# Patient Record
Sex: Female | Born: 1985 | Hispanic: Yes | Marital: Single | Smoking: Never smoker
Health system: Southern US, Community
[De-identification: ages and names within clinical notes are randomized; demographics above are authoritative.]

---

## 2015-04-11 ENCOUNTER — Emergency Department (HOSPITAL_COMMUNITY): Payer: Self-pay

## 2015-04-11 ENCOUNTER — Emergency Department (HOSPITAL_COMMUNITY): Payer: MEDICAID

## 2015-04-11 ENCOUNTER — Inpatient Hospital Stay (HOSPITAL_COMMUNITY)
Admission: EM | Admit: 2015-04-11 | Discharge: 2015-04-13 | DRG: 419 | Disposition: A | Discharge status: Home / Self Care | Payer: Self-pay | Attending: Surgery | Admitting: Surgery

## 2015-04-11 ENCOUNTER — Encounter (HOSPITAL_COMMUNITY): Payer: Self-pay | Admitting: Emergency Medicine

## 2015-04-11 DIAGNOSIS — Z6827 Body mass index (BMI) 27.0-27.9, adult: Secondary | ICD-10-CM

## 2015-04-11 DIAGNOSIS — K819 Cholecystitis, unspecified: Secondary | ICD-10-CM

## 2015-04-11 DIAGNOSIS — K810 Acute cholecystitis: Secondary | ICD-10-CM

## 2015-04-11 DIAGNOSIS — R1010 Upper abdominal pain, unspecified: Secondary | ICD-10-CM

## 2015-04-11 DIAGNOSIS — R740 Nonspecific elevation of levels of transaminase and lactic acid dehydrogenase [LDH]: Secondary | ICD-10-CM | POA: Diagnosis present

## 2015-04-11 DIAGNOSIS — E876 Hypokalemia: Secondary | ICD-10-CM | POA: Diagnosis present

## 2015-04-11 DIAGNOSIS — K8000 Calculus of gallbladder with acute cholecystitis without obstruction: Principal | ICD-10-CM | POA: Diagnosis present

## 2015-04-11 LAB — COMPREHENSIVE METABOLIC PANEL
ALT: 258 U/L — ABNORMAL HIGH (ref 14–54)
AST: 72 U/L — AB (ref 15–41)
Albumin: 4.1 g/dL (ref 3.5–5.0)
Alkaline Phosphatase: 138 U/L — ABNORMAL HIGH (ref 38–126)
Anion gap: 13 (ref 5–15)
BUN: 11 mg/dL (ref 6–20)
CHLORIDE: 104 mmol/L (ref 101–111)
CO2: 25 mmol/L (ref 22–32)
Calcium: 9.3 mg/dL (ref 8.9–10.3)
Creatinine, Ser: 0.63 mg/dL (ref 0.44–1.00)
Glucose, Bld: 99 mg/dL (ref 65–99)
Potassium: 3.3 mmol/L — ABNORMAL LOW (ref 3.5–5.1)
Sodium: 142 mmol/L (ref 135–145)
Total Bilirubin: 0.4 mg/dL (ref 0.3–1.2)
Total Protein: 7.6 g/dL (ref 6.5–8.1)

## 2015-04-11 LAB — PREGNANCY, URINE: Preg Test, Ur: NEGATIVE

## 2015-04-11 LAB — CBC
HEMATOCRIT: 36.4 % (ref 36.0–46.0)
HEMOGLOBIN: 12.2 g/dL (ref 12.0–15.0)
MCH: 29.8 pg (ref 26.0–34.0)
MCHC: 33.5 g/dL (ref 30.0–36.0)
MCV: 88.8 fL (ref 78.0–100.0)
Platelets: 304 10*3/uL (ref 150–400)
RBC: 4.1 MIL/uL (ref 3.87–5.11)
RDW: 13.7 % (ref 11.5–15.5)
WBC: 6.2 10*3/uL (ref 4.0–10.5)

## 2015-04-11 LAB — URINALYSIS, ROUTINE W REFLEX MICROSCOPIC
Glucose, UA: NEGATIVE mg/dL
Nitrite: NEGATIVE
PH: 5.5 (ref 5.0–8.0)
Protein, ur: NEGATIVE mg/dL
SPECIFIC GRAVITY, URINE: 1.027 (ref 1.005–1.030)

## 2015-04-11 LAB — URINE MICROSCOPIC-ADD ON

## 2015-04-11 LAB — LIPASE, BLOOD: LIPASE: 21 U/L (ref 11–51)

## 2015-04-11 MED ORDER — SIMETHICONE 80 MG PO CHEW: 40.0000 mg | CHEWABLE_TABLET | Freq: Four times a day (QID) | ORAL | Status: DC | PRN

## 2015-04-11 MED ORDER — MORPHINE SULFATE (PF) 2 MG/ML IV SOLN
1.0000 mg | INTRAVENOUS | Status: DC | PRN
  Administered 2015-04-12: 2 mg via INTRAVENOUS
  Administered 2015-04-12 (×2): 4 mg via INTRAVENOUS
  Administered 2015-04-12 (×2): 2 mg via INTRAVENOUS
  Filled 2015-04-11: qty 1
  Filled 2015-04-11: qty 2
  Filled 2015-04-11: qty 1
  Filled 2015-04-11: qty 2
  Filled 2015-04-11: qty 1
  Filled 2015-04-11: qty 2

## 2015-04-11 MED ORDER — ONDANSETRON HCL 4 MG/2ML IJ SOLN
4.0000 mg | Freq: Four times a day (QID) | INTRAMUSCULAR | Status: DC | PRN
  Administered 2015-04-12: 4 mg via INTRAVENOUS
  Filled 2015-04-11 (×2): qty 2

## 2015-04-11 MED ORDER — ZOLPIDEM TARTRATE 5 MG PO TABS: 5.0000 mg | ORAL_TABLET | Freq: Every evening | ORAL | Status: DC | PRN

## 2015-04-11 MED ORDER — DEXTROSE 5 % IV SOLN
2.0000 g | INTRAVENOUS | Status: DC
  Administered 2015-04-12 (×2): 2 g via INTRAVENOUS
  Filled 2015-04-11 (×2): qty 2

## 2015-04-11 MED ORDER — PROMETHAZINE HCL 25 MG/ML IJ SOLN: 12.5000 mg | Freq: Four times a day (QID) | INTRAMUSCULAR | Status: DC | PRN

## 2015-04-11 MED ORDER — SODIUM CHLORIDE 0.9 % IV BOLUS (SEPSIS)
1000.0000 mL | Freq: Once | INTRAVENOUS | Status: AC
  Administered 2015-04-11: 1000 mL via INTRAVENOUS

## 2015-04-11 MED ORDER — KCL IN DEXTROSE-NACL 20-5-0.45 MEQ/L-%-% IV SOLN
INTRAVENOUS | Status: DC
  Administered 2015-04-12: 01:00:00 via INTRAVENOUS
  Filled 2015-04-11: qty 1000

## 2015-04-11 MED ORDER — PANTOPRAZOLE SODIUM 40 MG IV SOLR
40.0000 mg | Freq: Every day | INTRAVENOUS | Status: DC
  Administered 2015-04-12 (×2): 40 mg via INTRAVENOUS
  Filled 2015-04-11 (×2): qty 40

## 2015-04-11 MED ORDER — ONDANSETRON 4 MG PO TBDP: 4.0000 mg | ORAL_TABLET | Freq: Four times a day (QID) | ORAL | Status: DC | PRN

## 2015-04-11 MED ORDER — DIPHENHYDRAMINE HCL 50 MG/ML IJ SOLN: 12.5000 mg | Freq: Four times a day (QID) | INTRAMUSCULAR | Status: DC | PRN

## 2015-04-11 MED ORDER — DIPHENHYDRAMINE HCL 12.5 MG/5ML PO ELIX: 12.5000 mg | ORAL_SOLUTION | Freq: Four times a day (QID) | ORAL | Status: DC | PRN

## 2015-04-11 NOTE — H&P (Signed)
Gloria Brown is an 30 y.o. female.  (history obtained via phone interpreter) Chief Complaint: abd pain, n/v. HPI: 30 yo Hispanic female came to ED for ongoing nausea/vomiting/abd pain. Some of the emesis became blood tinged. She developed upper abd pain starting Thursday. It has been intermittent but some discomfort has been constant. She has had ongoing episodes of n/v. No prior episodes. No fevers but chills. Moving bowels. No prior symptoms. Denies allergies and daily medications. Denies daily prescriptions. Has had surgery "to prevent pregnancy". No smoking. Has english speaking friend at bedside  History reviewed. No pertinent past medical history.  History reviewed. No pertinent past surgical history.  History reviewed. No pertinent family history. Social History:  reports that she has never smoked. She has never used smokeless tobacco. She reports that she does not drink alcohol or use illicit drugs.  Allergies: No Known Allergies   (Not in a hospital admission)  Results for orders placed or performed during the hospital encounter of 04/11/15 (from the past 48 hour(s))  Lipase, blood     Status: None   Collection Time: 04/11/15  5:59 PM  Result Value Ref Range   Lipase 21 11 - 51 U/L  Comprehensive metabolic panel     Status: Abnormal   Collection Time: 04/11/15  5:59 PM  Result Value Ref Range   Sodium 142 135 - 145 mmol/L   Potassium 3.3 (L) 3.5 - 5.1 mmol/L   Chloride 104 101 - 111 mmol/L   CO2 25 22 - 32 mmol/L   Glucose, Bld 99 65 - 99 mg/dL   BUN 11 6 - 20 mg/dL   Creatinine, Ser 0.63 0.44 - 1.00 mg/dL   Calcium 9.3 8.9 - 10.3 mg/dL   Total Protein 7.6 6.5 - 8.1 g/dL   Albumin 4.1 3.5 - 5.0 g/dL   AST 72 (H) 15 - 41 U/L   ALT 258 (H) 14 - 54 U/L   Alkaline Phosphatase 138 (H) 38 - 126 U/L   Total Bilirubin 0.4 0.3 - 1.2 mg/dL   GFR calc non Af Amer >60 >60 mL/min   GFR calc Af Amer >60 >60 mL/min    Comment: (NOTE) The eGFR has been calculated using the CKD  EPI equation. This calculation has not been validated in all clinical situations. eGFR's persistently <60 mL/min signify possible Chronic Kidney Disease.    Anion gap 13 5 - 15  CBC     Status: None   Collection Time: 04/11/15  5:59 PM  Result Value Ref Range   WBC 6.2 4.0 - 10.5 K/uL   RBC 4.10 3.87 - 5.11 MIL/uL   Hemoglobin 12.2 12.0 - 15.0 g/dL   HCT 36.4 36.0 - 46.0 %   MCV 88.8 78.0 - 100.0 fL   MCH 29.8 26.0 - 34.0 pg   MCHC 33.5 30.0 - 36.0 g/dL   RDW 13.7 11.5 - 15.5 %   Platelets 304 150 - 400 K/uL  Urinalysis, Routine w reflex microscopic (not at [facility])     Status: Abnormal   Collection Time: 04/11/15  6:04 PM  Result Value Ref Range   Color, Urine AMBER (A) YELLOW    Comment: BIOCHEMICALS MAY BE AFFECTED BY COLOR   APPearance HAZY (A) CLEAR   Specific Gravity, Urine 1.027 1.005 - 1.030   pH 5.5 5.0 - 8.0   Glucose, UA NEGATIVE NEGATIVE mg/dL   Hgb urine dipstick LARGE (A) NEGATIVE   Bilirubin Urine SMALL (A) NEGATIVE   Ketones, ur >80 (A)  NEGATIVE mg/dL   Protein, ur NEGATIVE NEGATIVE mg/dL   Nitrite NEGATIVE NEGATIVE   Leukocytes, UA SMALL (A) NEGATIVE  Urine microscopic-add on     Status: Abnormal   Collection Time: 04/11/15  6:04 PM  Result Value Ref Range   Squamous Epithelial / LPF 0-5 (A) NONE SEEN   WBC, UA 0-5 0 - 5 WBC/hpf   RBC / HPF 6-30 0 - 5 RBC/hpf   Bacteria, UA FEW (A) NONE SEEN  Pregnancy, urine     Status: None   Collection Time: 04/11/15  7:08 PM  Result Value Ref Range   Preg Test, Ur NEGATIVE NEGATIVE   US Abdomen Complete  04/11/2015  CLINICAL DATA:  Upper abdominal pain for 4 days. EXAM: ABDOMEN ULTRASOUND COMPLETE COMPARISON:  None. FINDINGS: Gallbladder: The gallbladder wall is thickened at 4.1 mm. Mobile stones and sludge are present. There is no sonographic Murphy's sign. Common bile duct: Diameter: 1.9 mm, within normal limits Liver: No focal lesion identified. Within normal limits in parenchymal echogenicity. IVC: No abnormality  visualized. Pancreas: Visualized portion unremarkable. Spleen: 5.8 cm, within normal limits.  No focal lesions are present. Right Kidney: Length: 10.6 cm, within normal limits a. Echogenicity within normal limits. No mass or hydronephrosis visualized. Left Kidney: Length: 10.8 cm, within normal limits. Echogenicity within normal limits. No mass or hydronephrosis visualized. Abdominal aorta: 2.1 cm, within normal limits Other findings: None. IMPRESSION: 1. Mobile gallstones and sludge with mild wall thickening. This is concerning for early cholecystitis. 2. Otherwise negative ultrasound. Electronically Signed   By: San Morelle M.D.   On: 04/11/2015 20:54    Review of Systems  Constitutional: Positive for chills. Negative for fever and weight loss.  HENT: Negative for nosebleeds.   Eyes: Negative for blurred vision.  Respiratory: Negative for shortness of breath.   Cardiovascular: Negative for chest pain, palpitations, orthopnea and PND.       Denies DOE  Gastrointestinal: Positive for nausea, vomiting and abdominal pain. Negative for constipation.  Genitourinary: Negative for dysuria and hematuria.  Musculoskeletal: Negative.   Skin: Negative for itching and rash.  Neurological: Negative for dizziness, focal weakness, seizures, loss of consciousness and headaches.       Denies TIAs, amaurosis fugax  Endo/Heme/Allergies: Does not bruise/bleed easily.  Psychiatric/Behavioral: The patient is not nervous/anxious.     Blood pressure 144/93, pulse 67, temperature 98.5 F (36.9 C), temperature source Oral, resp. rate 16, height 4' 11"  (1.499 m), weight 61.054 kg (134 lb 9.6 oz), last menstrual period 04/09/2015, SpO2 100 %. Physical Exam  Vitals reviewed. Constitutional: She is oriented to person, place, and time. She appears well-developed and well-nourished. No distress.  HENT:  Head: Normocephalic and atraumatic.  Right Ear: External ear normal.  Left Ear: External ear normal.  Eyes:  Conjunctivae are normal. No scleral icterus.  Neck: Normal range of motion. Neck supple. No tracheal deviation present. No thyromegaly present.  Cardiovascular: Normal rate and normal heart sounds.   Respiratory: Effort normal and breath sounds normal. No stridor. No respiratory distress. She has no wheezes.  GI: Soft. She exhibits no distension. There is tenderness in the right upper quadrant and epigastric area. There is no rigidity, no rebound and no guarding.    RUQ/epigastric TTP; no rt, guarding, peritonitis  Musculoskeletal: She exhibits no edema or tenderness.  Lymphadenopathy:    She has no cervical adenopathy.  Neurological: She is alert and oriented to person, place, and time. She exhibits normal muscle tone.  Skin: Skin  is warm and dry. No rash noted. She is not diaphoretic. No erythema. No pallor.  Psychiatric: She has a normal mood and affect. Her behavior is normal. Judgment and thought content normal.     Assessment/Plan Acute calculous cholecystitis Elevated transaminases likely due to above Hypokalemia  I believe the patient's symptoms are consistent with gallbladder disease.  We discussed gallbladder disease.   I discussed laparoscopic cholecystectomy with IOC in detail.  The patient was given educational material in Spanish as well as shown diagrams detailing the procedure.  We discussed the risks and benefits of a laparoscopic cholecystectomy including, but not limited to bleeding, infection, injury to surrounding structures such as the intestine or liver, bile leak, retained gallstones, need to convert to an open procedure, prolonged diarrhea, blood clots such as  DVT, common bile duct injury, anesthesia risks, and possible need for additional procedures.  We discussed the typical post-operative recovery course. I explained that the likelihood of improvement of their symptoms is good.  Admit Clears, npo p mn Ivf  Iv pain meds Iv abx Surgery tentatively Monday  with Dr Georgette Dover  All of this was done with phone interpreter Leighton Ruff. Redmond Pulling, MD, FACS General, Bariatric, & Minimally Invasive Surgery [facility], [place]  [facility] M 04/11/2015, 10:29 PM

## 2015-04-11 NOTE — ED Provider Notes (Signed)
CSN: [id number]     Arrival date & time 04/11/15  1651 History   First MD Initiated Contact with Patient 04/11/15 1855     Chief Complaint  Patient presents with  . Abdominal Pain  . Hematemesis  . Dizziness     (Consider location/radiation/quality/duration/timing/severity/associated sxs/prior Treatment) HPI Comments: History is obtained through a Spanish interpreter line. Patient is a 30 year old Hispanic female. She complains of a one-week history of worsening pain across her upper abdomen. She's had associated nausea and vomiting. She does say that the pain radiates to her back. It is worse after eating. It now as a constant throbbing pain. She's has had decreased appetite. Her emesis is mostly nonbloody and nonbilious but she has had 2 episodes where it's been streaked with blood. She's had no vomiting today. She denies any known fevers but she's had chills. She denies any change in bowel habits. She denies any urinary symptoms. Her last menstrual period was 2 days ago. She denies any past abdominal surgeries.  Patient is a 30 y.o. female presenting with abdominal pain and dizziness.  Abdominal Pain Associated symptoms: chills, fatigue, nausea and vomiting   Associated symptoms: no chest pain, no cough, no diarrhea, no fever, no hematuria and no shortness of breath   Dizziness Associated symptoms: nausea and vomiting   Associated symptoms: no blood in stool, no chest pain, no diarrhea, no headaches, no shortness of breath and no weakness     History reviewed. No pertinent past medical history. History reviewed. No pertinent past surgical history. History reviewed. No pertinent family history. Social History  Substance Use Topics  . Smoking status: Never Smoker   . Smokeless tobacco: Never Used  . Alcohol Use: No   OB History    No data available     Review of Systems  Constitutional: Positive for chills and fatigue. Negative for fever and diaphoresis.  HENT: Negative for  congestion, rhinorrhea and sneezing.   Eyes: Negative.   Respiratory: Negative for cough, chest tightness and shortness of breath.   Cardiovascular: Negative for chest pain and leg swelling.  Gastrointestinal: Positive for nausea, vomiting and abdominal pain. Negative for diarrhea and blood in stool.  Genitourinary: Negative for frequency, hematuria, flank pain and difficulty urinating.  Musculoskeletal: Negative for back pain and arthralgias.  Skin: Negative for rash.  Neurological: Positive for dizziness. Negative for speech difficulty, weakness, numbness and headaches.      Allergies  Review of patient's allergies indicates no known allergies.  Home Medications   Prior to Admission medications   Not on File   BP 144/93 mmHg  Pulse 67  Temp(Src) 98.5 F (36.9 C) (Oral)  Resp 16  Ht  (1.499 m)  Wt 134 lb 9.6 oz (61.054 kg)  BMI 27.17 kg/m2  SpO2 100%  LMP 04/09/2015 (Exact Date) Physical Exam  Constitutional: She is oriented to person, place, and time. She appears well-developed and well-nourished.  HENT:  Head: Normocephalic and atraumatic.  Eyes: Pupils are equal, round, and reactive to light.  Neck: Normal range of motion. Neck supple.  Cardiovascular: Normal rate, regular rhythm and normal heart sounds.   Pulmonary/Chest: Effort normal and breath sounds normal. No respiratory distress. She has no wheezes. She has no rales. She exhibits no tenderness.  Abdominal: Soft. Bowel sounds are normal. There is tenderness (positive tenderness across the upper abdomen and epigastrium). There is no rebound and no guarding.  Musculoskeletal: Normal range of motion. She exhibits no edema.  Lymphadenopathy:  She has no cervical adenopathy.  Neurological: She is alert and oriented to person, place, and time.  Skin: Skin is warm and dry. No rash noted.  Psychiatric: She has a normal mood and affect.    ED Course  Procedures (including critical care time) Labs  Review Labs Reviewed  COMPREHENSIVE METABOLIC PANEL - Abnormal; Notable for the following:    Potassium 3.3 (*)    AST 72 (*)    ALT 258 (*)    Alkaline Phosphatase 138 (*)    All other components within normal limits  URINALYSIS, ROUTINE W REFLEX MICROSCOPIC (NOT AT [facility]) - Abnormal; Notable for the following:    Color, Urine AMBER (*)    APPearance HAZY (*)    Hgb urine dipstick LARGE (*)    Bilirubin Urine SMALL (*)    Ketones, ur >80 (*)    Leukocytes, UA SMALL (*)    All other components within normal limits  URINE MICROSCOPIC-ADD ON - Abnormal; Notable for the following:    Squamous Epithelial / LPF 0-5 (*)    Bacteria, UA FEW (*)    All other components within normal limits  LIPASE, BLOOD  CBC  PREGNANCY, URINE    Imaging Review US Abdomen Complete  04/11/2015  CLINICAL DATA:  Upper abdominal pain for 4 days. EXAM: ABDOMEN ULTRASOUND COMPLETE COMPARISON:  None. FINDINGS: Gallbladder: The gallbladder wall is thickened at 4.1 mm. Mobile stones and sludge are present. There is no sonographic Murphy's sign. Common bile duct: Diameter: 1.9 mm, within normal limits Liver: No focal lesion identified. Within normal limits in parenchymal echogenicity. IVC: No abnormality visualized. Pancreas: Visualized portion unremarkable. Spleen: 5.8 cm, within normal limits.  No focal lesions are present. Right Kidney: Length: 10.6 cm, within normal limits a. Echogenicity within normal limits. No mass or hydronephrosis visualized. Left Kidney: Length: 10.8 cm, within normal limits. Echogenicity within normal limits. No mass or hydronephrosis visualized. Abdominal aorta: 2.1 cm, within normal limits Other findings: None. IMPRESSION: 1. Mobile gallstones and sludge with mild wall thickening. This is concerning for early cholecystitis. 2. Otherwise negative ultrasound. Electronically Signed   By: Marin Roberts M.D.   On: 04/11/2015 20:54   I have personally reviewed and evaluated these images and  lab results as part of my medical decision-making.   EKG Interpretation None      MDM   Final diagnoses:  Cholecystitis    Patient has an ultrasound showing positive gallstones with mild wall thickening. Her white count is normal and she is afebrile. However LFTs are mildly elevated. Her lipase is normal. I consulted Dr. Andrey Campanile with general surgery who will admit the patient for a cholecystectomy. Patient currently denies the need for any pain medication.    Rolan Bucco, MD 04/11/15 2248

## 2015-04-11 NOTE — ED Notes (Addendum)
Pt from home with c/o LUQ pain with emesis, chills, and dizziness since this past Thurs.  Pt reports she is now vomiting blood.  Denies diarrhea or fever.  Pt states she hasn't been around anyone who has been sick.  Pt speaks only [place].  Family friend at bedside translating.  NAD, A&O.

## 2015-04-11 NOTE — ED Notes (Signed)
Report attempted 

## 2015-04-12 ENCOUNTER — Inpatient Hospital Stay (HOSPITAL_COMMUNITY): Payer: Self-pay

## 2015-04-12 ENCOUNTER — Encounter (HOSPITAL_COMMUNITY): Admission: EM | Disposition: A | Discharge status: Home / Self Care | Payer: Self-pay | Source: Home / Self Care

## 2015-04-12 ENCOUNTER — Inpatient Hospital Stay (HOSPITAL_COMMUNITY): Payer: MEDICAID | Admitting: Anesthesiology

## 2015-04-12 ENCOUNTER — Encounter (HOSPITAL_COMMUNITY): Payer: Self-pay | Admitting: Anesthesiology

## 2015-04-12 ENCOUNTER — Inpatient Hospital Stay (HOSPITAL_COMMUNITY): Payer: Self-pay | Admitting: Anesthesiology

## 2015-04-12 HISTORY — PX: CHOLECYSTECTOMY: SHX55

## 2015-04-12 LAB — COMPREHENSIVE METABOLIC PANEL
ALBUMIN: 3.4 g/dL — AB (ref 3.5–5.0)
ALT: 169 U/L — AB (ref 14–54)
AST: 43 U/L — AB (ref 15–41)
Alkaline Phosphatase: 102 U/L (ref 38–126)
Anion gap: 5 (ref 5–15)
BUN: 10 mg/dL (ref 6–20)
CHLORIDE: 110 mmol/L (ref 101–111)
CO2: 25 mmol/L (ref 22–32)
CREATININE: 0.6 mg/dL (ref 0.44–1.00)
Calcium: 8.4 mg/dL — ABNORMAL LOW (ref 8.9–10.3)
GFR calc Af Amer: >60 mL/min (ref >60)
GFR calc non Af Amer: >60 mL/min (ref >60)
GLUCOSE: 100 mg/dL — AB (ref 65–99)
POTASSIUM: 3.5 mmol/L (ref 3.5–5.1)
SODIUM: 140 mmol/L (ref 135–145)
Total Bilirubin: 0.4 mg/dL (ref 0.3–1.2)
Total Protein: 6.3 g/dL — ABNORMAL LOW (ref 6.5–8.1)

## 2015-04-12 LAB — SURGICAL PCR SCREEN
MRSA, PCR: NEGATIVE
Staphylococcus aureus: NEGATIVE

## 2015-04-12 LAB — CBC
HEMATOCRIT: 32.1 % — AB (ref 36.0–46.0)
Hemoglobin: 11 g/dL — ABNORMAL LOW (ref 12.0–15.0)
MCH: 30.3 pg (ref 26.0–34.0)
MCHC: 34.3 g/dL (ref 30.0–36.0)
MCV: 88.4 fL (ref 78.0–100.0)
PLATELETS: 266 10*3/uL (ref 150–400)
RBC: 3.63 MIL/uL — ABNORMAL LOW (ref 3.87–5.11)
RDW: 13.6 % (ref 11.5–15.5)
WBC: 5.9 10*3/uL (ref 4.0–10.5)

## 2015-04-12 SURGERY — LAPAROSCOPIC CHOLECYSTECTOMY WITH INTRAOPERATIVE CHOLANGIOGRAM
Anesthesia: General | Site: Abdomen

## 2015-04-12 MED ORDER — ONDANSETRON HCL 4 MG/2ML IJ SOLN
INTRAMUSCULAR | Status: AC
  Filled 2015-04-12: qty 2

## 2015-04-12 MED ORDER — PROPOFOL 10 MG/ML IV BOLUS
INTRAVENOUS | Status: DC | PRN
  Administered 2015-04-12: 180 mg via INTRAVENOUS

## 2015-04-12 MED ORDER — PROMETHAZINE HCL 25 MG/ML IJ SOLN: 6.2500 mg | INTRAMUSCULAR | Status: DC | PRN

## 2015-04-12 MED ORDER — LACTATED RINGERS IV SOLN
INTRAVENOUS | Status: DC
  Administered 2015-04-12 (×2): via INTRAVENOUS

## 2015-04-12 MED ORDER — MIDAZOLAM HCL 2 MG/2ML IJ SOLN
INTRAMUSCULAR | Status: AC
  Filled 2015-04-12: qty 2

## 2015-04-12 MED ORDER — SUGAMMADEX SODIUM 200 MG/2ML IV SOLN
INTRAVENOUS | Status: AC
  Filled 2015-04-12: qty 2

## 2015-04-12 MED ORDER — BUPIVACAINE-EPINEPHRINE (PF) 0.25% -1:200000 IJ SOLN
INTRAMUSCULAR | Status: AC
  Filled 2015-04-12: qty 30

## 2015-04-12 MED ORDER — ROCURONIUM BROMIDE 50 MG/5ML IV SOLN
INTRAVENOUS | Status: AC
  Filled 2015-04-12: qty 1

## 2015-04-12 MED ORDER — HYDROMORPHONE HCL 1 MG/ML IJ SOLN
INTRAMUSCULAR | Status: AC
  Filled 2015-04-12: qty 1

## 2015-04-12 MED ORDER — MIDAZOLAM HCL 5 MG/5ML IJ SOLN
INTRAMUSCULAR | Status: DC | PRN
  Administered 2015-04-12 (×2): 1 mg via INTRAVENOUS

## 2015-04-12 MED ORDER — OXYCODONE-ACETAMINOPHEN 5-325 MG PO TABS
1.0000 | ORAL_TABLET | ORAL | Status: DC | PRN
  Administered 2015-04-13: 1 via ORAL
  Filled 2015-04-12: qty 1

## 2015-04-12 MED ORDER — ONDANSETRON HCL 4 MG/2ML IJ SOLN
INTRAMUSCULAR | Status: DC | PRN
  Administered 2015-04-12: 4 mg via INTRAVENOUS

## 2015-04-12 MED ORDER — LIDOCAINE HCL (CARDIAC) 20 MG/ML IV SOLN
INTRAVENOUS | Status: DC | PRN
  Administered 2015-04-12: 60 mg via INTRAVENOUS

## 2015-04-12 MED ORDER — KCL IN DEXTROSE-NACL 20-5-0.45 MEQ/L-%-% IV SOLN
INTRAVENOUS | Status: DC
  Administered 2015-04-13: 07:00:00 via INTRAVENOUS
  Filled 2015-04-12 (×2): qty 1000

## 2015-04-12 MED ORDER — SODIUM CHLORIDE 0.9 % IR SOLN
Status: DC | PRN
  Administered 2015-04-12: 1000 mL

## 2015-04-12 MED ORDER — PHENYLEPHRINE HCL 10 MG/ML IJ SOLN
INTRAMUSCULAR | Status: DC | PRN
  Administered 2015-04-12: 80 ug via INTRAVENOUS

## 2015-04-12 MED ORDER — SODIUM CHLORIDE 0.9 % IV SOLN
INTRAVENOUS | Status: DC | PRN
  Administered 2015-04-12: 6 mL

## 2015-04-12 MED ORDER — ROCURONIUM BROMIDE 100 MG/10ML IV SOLN
INTRAVENOUS | Status: DC | PRN
  Administered 2015-04-12: 40 mg via INTRAVENOUS

## 2015-04-12 MED ORDER — LIDOCAINE HCL (CARDIAC) 20 MG/ML IV SOLN
INTRAVENOUS | Status: AC
  Filled 2015-04-12: qty 5

## 2015-04-12 MED ORDER — FENTANYL CITRATE (PF) 250 MCG/5ML IJ SOLN
INTRAMUSCULAR | Status: AC
  Filled 2015-04-12: qty 5

## 2015-04-12 MED ORDER — EPHEDRINE SULFATE 50 MG/ML IJ SOLN
INTRAMUSCULAR | Status: DC | PRN
  Administered 2015-04-12: 10 mg via INTRAVENOUS

## 2015-04-12 MED ORDER — FENTANYL CITRATE (PF) 100 MCG/2ML IJ SOLN
INTRAMUSCULAR | Status: DC | PRN
  Administered 2015-04-12 (×3): 50 ug via INTRAVENOUS
  Administered 2015-04-12: 100 ug via INTRAVENOUS

## 2015-04-12 MED ORDER — 0.9 % SODIUM CHLORIDE (POUR BTL) OPTIME
TOPICAL | Status: DC | PRN
  Administered 2015-04-12: 1000 mL

## 2015-04-12 MED ORDER — HYDROMORPHONE HCL 1 MG/ML IJ SOLN
0.2500 mg | INTRAMUSCULAR | Status: DC | PRN
Start: 2015-04-12 — End: 2015-04-12
  Administered 2015-04-12: 0.5 mg via INTRAVENOUS

## 2015-04-12 MED ORDER — BUPIVACAINE-EPINEPHRINE 0.25% -1:200000 IJ SOLN
INTRAMUSCULAR | Status: DC | PRN
  Administered 2015-04-12: 9 mL

## 2015-04-12 MED ORDER — SUGAMMADEX SODIUM 200 MG/2ML IV SOLN
INTRAVENOUS | Status: DC | PRN
  Administered 2015-04-12: 120 mg via INTRAVENOUS

## 2015-04-12 MED ORDER — PROPOFOL 10 MG/ML IV BOLUS
INTRAVENOUS | Status: AC
  Filled 2015-04-12: qty 20

## 2015-04-12 SURGICAL SUPPLY — 45 items
APPLIER CLIP ROT 10 11.4 M/L (STAPLE) ×3
BENZOIN TINCTURE PRP APPL 2/3 (GAUZE/BANDAGES/DRESSINGS) ×3 IMPLANT
BLADE SURG ROTATE 9660 (MISCELLANEOUS) IMPLANT
CANISTER SUCTION 2500CC (MISCELLANEOUS) ×3 IMPLANT
CHLORAPREP W/TINT 26ML (MISCELLANEOUS) ×3 IMPLANT
CLIP APPLIE ROT 10 11.4 M/L (STAPLE) ×1 IMPLANT
CLOSURE STERI-STRIP 1/2X4 (GAUZE/BANDAGES/DRESSINGS) ×1
CLOSURE WOUND 1/2 X4 (GAUZE/BANDAGES/DRESSINGS) ×1
CLSR STERI-STRIP ANTIMIC 1/2X4 (GAUZE/BANDAGES/DRESSINGS) ×2 IMPLANT
COVER MAYO STAND STRL (DRAPES) ×3 IMPLANT
COVER SURGICAL LIGHT HANDLE (MISCELLANEOUS) ×3 IMPLANT
DRAPE C-ARM 42X72 X-RAY (DRAPES) ×3 IMPLANT
DRSG TEGADERM 2-3/8X2-3/4 SM (GAUZE/BANDAGES/DRESSINGS) ×3 IMPLANT
DRSG TEGADERM 4X4.75 (GAUZE/BANDAGES/DRESSINGS) ×3 IMPLANT
ELECT REM PT RETURN 9FT ADLT (ELECTROSURGICAL) ×3
ELECTRODE REM PT RTRN 9FT ADLT (ELECTROSURGICAL) ×1 IMPLANT
FILTER SMOKE EVAC LAPAROSHD (FILTER) ×3 IMPLANT
GAUZE SPONGE 2X2 8PLY STRL LF (GAUZE/BANDAGES/DRESSINGS) ×1 IMPLANT
GLOVE BIO SURGEON STRL SZ7 (GLOVE) ×6 IMPLANT
GLOVE BIO SURGEON STRL SZ7.5 (GLOVE) ×3 IMPLANT
GLOVE BIOGEL PI IND STRL 7.5 (GLOVE) ×1 IMPLANT
GLOVE BIOGEL PI INDICATOR 7.5 (GLOVE) ×2
GLOVE SURG SS PI 7.5 STRL IVOR (GLOVE) ×3 IMPLANT
GOWN STRL REUS W/ TWL LRG LVL3 (GOWN DISPOSABLE) ×3 IMPLANT
GOWN STRL REUS W/TWL LRG LVL3 (GOWN DISPOSABLE) ×6
KIT BASIN OR (CUSTOM PROCEDURE TRAY) ×3 IMPLANT
KIT ROOM TURNOVER OR (KITS) ×3 IMPLANT
NS IRRIG 1000ML POUR BTL (IV SOLUTION) ×3 IMPLANT
PAD ARMBOARD 7.5X6 YLW CONV (MISCELLANEOUS) ×3 IMPLANT
POUCH SPECIMEN RETRIEVAL 10MM (ENDOMECHANICALS) ×3 IMPLANT
SCISSORS LAP 5X35 DISP (ENDOMECHANICALS) ×3 IMPLANT
SET CHOLANGIOGRAPH 5 50 .035 (SET/KITS/TRAYS/PACK) ×3 IMPLANT
SET IRRIG TUBING LAPAROSCOPIC (IRRIGATION / IRRIGATOR) ×3 IMPLANT
SLEEVE ENDOPATH XCEL 5M (ENDOMECHANICALS) ×3 IMPLANT
SPECIMEN JAR SMALL (MISCELLANEOUS) ×3 IMPLANT
SPONGE GAUZE 2X2 STER 10/PKG (GAUZE/BANDAGES/DRESSINGS) ×2
STRIP CLOSURE SKIN 1/2X4 (GAUZE/BANDAGES/DRESSINGS) ×2 IMPLANT
SUT MNCRL AB 4-0 PS2 18 (SUTURE) ×3 IMPLANT
TOWEL OR 17X24 6PK STRL BLUE (TOWEL DISPOSABLE) ×3 IMPLANT
TOWEL OR 17X26 10 PK STRL BLUE (TOWEL DISPOSABLE) ×3 IMPLANT
TRAY LAPAROSCOPIC MC (CUSTOM PROCEDURE TRAY) ×3 IMPLANT
TROCAR XCEL BLUNT TIP 100MML (ENDOMECHANICALS) ×3 IMPLANT
TROCAR XCEL NON-BLD 11X100MML (ENDOMECHANICALS) ×3 IMPLANT
TROCAR XCEL NON-BLD 5MMX100MML (ENDOMECHANICALS) ×3 IMPLANT
TUBING INSUFFLATION (TUBING) ×3 IMPLANT

## 2015-04-12 NOTE — Anesthesia Preprocedure Evaluation (Signed)
Anesthesia Evaluation  Patient identified by MRN, date of birth, ID band Patient awake    Reviewed: Allergy & Precautions, NPO status , Patient's Chart, lab work & pertinent test results  History of Anesthesia Complications Negative for: history of anesthetic complications  Airway Mallampati: II  TM Distance: >3 FB Neck ROM: Full    Dental  (+) Teeth Intact, Dental Advisory Given   Pulmonary neg pulmonary ROS,    Pulmonary exam normal        Cardiovascular negative cardio ROS Normal cardiovascular exam     Neuro/Psych negative neurological ROS  negative psych ROS   GI/Hepatic Neg liver ROS,   Endo/Other  negative endocrine ROS  Renal/GU negative Renal ROS     Musculoskeletal   Abdominal   Peds  Hematology   Anesthesia Other Findings   Reproductive/Obstetrics                             Anesthesia Physical Anesthesia Plan  ASA: II  Anesthesia Plan: General   Post-op Pain Management:    Induction: Intravenous  Airway Management Planned: Oral ETT  Additional Equipment:   Intra-op Plan:   Post-operative Plan: Extubation in OR  Informed Consent: I have reviewed the patients History and Physical, chart, labs and discussed the procedure including the risks, benefits and alternatives for the proposed anesthesia with the patient or authorized representative who has indicated his/her understanding and acceptance.   Dental advisory given  Plan Discussed with: CRNA, Anesthesiologist and Surgeon  Anesthesia Plan Comments:         Anesthesia Quick Evaluation

## 2015-04-12 NOTE — Anesthesia Procedure Notes (Signed)
Procedure Name: Intubation Date/Time: 04/12/2015 11:27 AM Performed by: Orlinda Blalock, Nagi Furio L Pre-anesthesia Checklist: Patient identified, Emergency Drugs available, Suction available, Patient being monitored and Timeout performed Patient Re-evaluated:Patient Re-evaluated prior to inductionOxygen Delivery Method: Circle system utilized Preoxygenation: Pre-oxygenation with 100% oxygen Intubation Type: IV induction Ventilation: Mask ventilation without difficulty Laryngoscope Size: Mac and 3 Grade View: Grade II Tube type: Oral Tube size: 7.0 mm Number of attempts: 1 Airway Equipment and Method: Stylet Placement Confirmation: ETT inserted through vocal cords under direct vision,  positive ETCO2 and breath sounds checked- equal and bilateral Secured at: 20 cm Tube secured with: Tape Dental Injury: Teeth and Oropharynx as per pre-operative assessment

## 2015-04-12 NOTE — Transfer of Care (Signed)
Immediate Anesthesia Transfer of Care Note  Patient: Gloria Brown  Procedure(s) Performed: Procedure(s): LAPAROSCOPIC CHOLECYSTECTOMY WITH INTRAOPERATIVE CHOLANGIOGRAM (N/A)  Patient Location: PACU  Anesthesia Type:General  Level of Consciousness: awake, alert , oriented and patient cooperative  Airway & Oxygen Therapy: Patient Spontanous Breathing and Patient connected to nasal cannula oxygen  Post-op Assessment: Report given to RN, Post -op Vital signs reviewed and stable and Patient moving all extremities  Post vital signs: Reviewed and stable  Last Vitals:  Filed Vitals:   04/12/15 0542 04/12/15 1230  BP: 137/72 101/57  Pulse: 56 92  Temp: 36.9 C   Resp: 18 13    Complications: No apparent anesthesia complications

## 2015-04-12 NOTE — Care Management Note (Signed)
Case Management Note  Patient Details  Name: Gloria Brown MRN: [id number] Date of Birth: November 07, 1985  Subjective/Objective:                    Action/Plan:  Initial UR completed  Expected Discharge Date:                  Expected Discharge Plan:  Home/Self Care  In-House Referral:     Discharge planning Services     Post Acute Care Choice:    Choice offered to:     DME Arranged:    DME Agency:     HH Arranged:    HH Agency:     Status of Service:  In process, will continue to follow  Medicare Important Message Given:    Date Medicare IM Given:    Medicare IM give by:    Date Additional Medicare IM Given:    Additional Medicare Important Message give by:     If discussed at Long Length of Stay Meetings, dates discussed:    Additional Comments:  Kingsley Plan, RN 04/12/2015, 9:51 AM

## 2015-04-12 NOTE — Addendum Note (Signed)
Addendum  created 04/12/15 1520 by Andree Elk, CRNA   Modules edited: Anesthesia Medication Administration

## 2015-04-12 NOTE — Discharge Instructions (Signed)
Laparoscopic Cholecystectomy, Care After °Refer to this sheet in the next few weeks. These instructions provide you with information about caring for yourself after your procedure. Your health care provider may also give you more specific instructions. Your treatment has been planned according to current medical practices, but problems sometimes occur. Call your health care provider if you have any problems or questions after your procedure. °WHAT TO EXPECT AFTER THE PROCEDURE °After your procedure, it is common to have: °· Pain at your incision sites. You will be given pain medicines to control your pain. °· Mild nausea or vomiting. This should improve after the first 24 hours. °· Bloating and possible shoulder pain from the gas that was used during the procedure. This will improve after the first 24 hours. °HOME CARE INSTRUCTIONS °Incision Care °· Follow instructions from your health care provider about how to take care of your incisions. Make sure you: °¨ Wash your hands with soap and water before you change your bandage (dressing). If soap and water are not available, use hand sanitizer. °¨ Change your dressing as told by your health care provider. °¨ Leave stitches (sutures), skin glue, or adhesive strips in place. These skin closures may need to be in place for 2 weeks or longer. If adhesive strip edges start to loosen and curl up, you may trim the loose edges. Do not remove adhesive strips completely unless your health care provider tells you to do that. °· Do not take baths, swim, or use a hot tub until your health care provider approves. Ask your health care provider if you can take showers. You may only be allowed to take sponge baths for bathing. °General Instructions °· Take over-the-counter and prescription medicines only as told by your health care provider. °· Do not drive or operate heavy machinery while taking prescription pain medicine. °· Return to your normal diet as told by your health care  provider. °· Do not lift anything that is heavier than 10 lb (4.5 kg). °· Do not play contact sports for one week or until your health care provider approves. °SEEK MEDICAL CARE IF:  °· You have redness, swelling, or pain at the site of your incision. °· You have fluid, blood, or pus coming from your incision. °· You notice a bad smell coming from your incision area. °· Your surgical incisions break open. °· You have a fever. °SEEK IMMEDIATE MEDICAL CARE IF: °· You develop a rash. °· You have difficulty breathing. °· You have chest pain. °· You have increasing pain in your shoulders (shoulder strap areas). °· You faint or have dizzy episodes while you are standing. °· You have severe pain in your abdomen. °· You have nausea or vomiting that lasts for more than one day. °  °This information is not intended to replace advice given to you by your health care provider. Make sure you discuss any questions you have with your health care provider. °  °Document Released: 02/27/2005 Document Revised: 11/18/2014 Document Reviewed: 10/09/2012 °Elsevier Interactive Patient Education ©2016 [employer] °CCS ______[facility] °LAPAROSCOPIC SURGERY: POST OP INSTRUCTIONS °Always review your discharge instruction sheet given to you by the facility where your surgery was performed. °IF YOU HAVE DISABILITY OR FAMILY LEAVE FORMS, YOU MUST BRING THEM TO THE OFFICE FOR PROCESSING.   °DO NOT GIVE THEM TO YOUR DOCTOR. ° °1. A prescription for pain medication may be given to you upon discharge.  Take your pain medication as prescribed, if needed.  If narcotic   pain medicine is not needed, then you may take acetaminophen (Tylenol) or ibuprofen (Advil) as needed. °2. Take your usually prescribed medications unless otherwise directed. °3. If you need a refill on your pain medication, please contact your pharmacy.  They will contact our office to request authorization. Prescriptions will not be filled after 5pm or on  week-ends. °4. You should follow a light diet the first few days after arrival home, such as soup and crackers, etc.  Be sure to include lots of fluids daily. °5. Most patients will experience some swelling and bruising in the area of the incisions.  Ice packs will help.  Swelling and bruising can take several days to resolve.  °6. It is common to experience some constipation if taking pain medication after surgery.  Increasing fluid intake and taking a stool softener (such as Colace) will usually help or prevent this problem from occurring.  A mild laxative (Milk of Magnesia or Miralax) should be taken according to package instructions if there are no bowel movements after 48 hours. °7. Unless discharge instructions indicate otherwise, you may remove your bandages 24-48 hours after surgery, and you may shower at that time.  You may have steri-strips (small skin tapes) in place directly over the incision.  These strips should be left on the skin for 7-10 days.  If your surgeon used skin glue on the incision, you may shower in 24 hours.  The glue will flake off over the next 2-3 weeks.  Any sutures or staples will be removed at the office during your follow-up visit. °8. ACTIVITIES:  You may resume regular (light) daily activities beginning the next day--such as daily self-care, walking, climbing stairs--gradually increasing activities as tolerated.  You may have sexual intercourse when it is comfortable.  Refrain from any heavy lifting or straining until approved by your doctor. °a. You may drive when you are no longer taking prescription pain medication, you can comfortably wear a seatbelt, and you can safely maneuver your car and apply brakes. °b. RETURN TO WORK:  __________________________________________________________ °9. You should see your doctor in the office for a follow-up appointment approximately 2-3 weeks after your surgery.  Make sure that you call for this appointment within a day or two after you  arrive home to insure a convenient appointment time. °10. OTHER INSTRUCTIONS: __________________________________________________________________________________________________________________________ __________________________________________________________________________________________________________________________ °WHEN TO CALL YOUR DOCTOR: °1. Fever over 101.0 °2. Inability to urinate °3. Continued bleeding from incision. °4. Increased pain, redness, or drainage from the incision. °5. Increasing abdominal pain ° °The clinic staff is available to answer your questions during regular business hours.  Please don’t hesitate to call and ask to speak to one of the nurses for clinical concerns.  If you have a medical emergency, go to the nearest emergency room or call 911.  A surgeon from [facility] is always on call at the hospital. °[address], [address], [place], [place]  [address] ? [address] [address], [place],    [address] °[phone] ? [phone] ? FAX [phone] °Web site: [email or web address] ° °

## 2015-04-12 NOTE — Op Note (Signed)
Laparoscopic Cholecystectomy with IOC Procedure Note  Indications: This patient presents with symptomatic gallbladder disease and will undergo laparoscopic cholecystectomy.  Pre-operative Diagnosis: Calculus of gallbladder with acute cholecystitis, without mention of obstruction  Post-operative Diagnosis: Same  Surgeon: Wilberth Damon K.   Assistants: none  Anesthesia: General endotracheal anesthesia  ASA Class: 1  Procedure Details  The patient was seen again in the Holding Room. The risks, benefits, complications, treatment options, and expected outcomes were discussed with the patient. The possibilities of reaction to medication, pulmonary aspiration, perforation of viscus, bleeding, recurrent infection, finding a normal gallbladder, the need for additional procedures, failure to diagnose a condition, the possible need to convert to an open procedure, and creating a complication requiring transfusion or operation were discussed with the patient. The likelihood of improving the patient's symptoms with return to their baseline status is good.  The patient and/or family concurred with the proposed plan, giving informed consent. The site of surgery properly noted. The patient was taken to Operating Room, identified as Gloria Brown and the procedure verified as Laparoscopic Cholecystectomy with Intraoperative Cholangiogram. A Time Out was held and the above information confirmed.  Prior to the induction of general anesthesia, antibiotic prophylaxis was administered. General endotracheal anesthesia was then administered and tolerated well. After the induction, the abdomen was prepped with Chloraprep and draped in the sterile fashion. The patient was positioned in the supine position.  Local anesthetic agent was injected into the skin near the umbilicus and an incision made through her old BTL incision. We dissected down to the abdominal fascia with blunt dissection.  The fascia was incised  vertically and we entered the peritoneal cavity bluntly.  A pursestring suture of 0-Vicryl was placed around the fascial opening.  The Hasson cannula was inserted and secured with the stay suture.  Pneumoperitoneum was then created with CO2 and tolerated well without any adverse changes in the patient's vital signs. An 11-mm port was placed in the subxiphoid position.  Two 5-mm ports were placed in the right upper quadrant. All skin incisions were infiltrated with a local anesthetic agent before making the incision and placing the trocars.   We positioned the patient in reverse Trendelenburg, tilted slightly to the patient's left.  The gallbladder was identified, the fundus grasped and retracted cephalad.  The gallbladder was slightly thickened and edematous. Adhesions were lysed bluntly and with the electrocautery where indicated, taking care not to injure any adjacent organs or viscus. The infundibulum was grasped and retracted laterally, exposing the peritoneum overlying the triangle of Calot. This was then divided and exposed in a blunt fashion. A critical view of the cystic duct and cystic artery was obtained.  The cystic duct was clearly identified and bluntly dissected circumferentially. The cystic duct was ligated with a clip distally.   An incision was made in the cystic duct and the [facility] cholangiogram catheter introduced. The catheter was secured using a clip. A cholangiogram was then obtained which showed good visualization of the distal and proximal biliary tree with no sign of filling defects or obstruction.  Contrast flowed easily into the duodenum. The catheter was then removed.   The cystic duct was then ligated with clips and divided. The cystic artery was identified, dissected free, ligated with clips and divided as well.   The gallbladder was dissected from the liver bed in retrograde fashion with the electrocautery. The gallbladder was removed and placed in an Endocatch sac. The liver bed  was irrigated and inspected. Hemostasis was achieved with  the electrocautery. Copious irrigation was utilized and was repeatedly aspirated until clear.  The gallbladder and Endocatch sac were then removed through the umbilical port site.  The pursestring suture was used to close the umbilical fascia.    We again inspected the right upper quadrant for hemostasis.  Pneumoperitoneum was released as we removed the trocars.  4-0 Monocryl was used to close the skin.   Benzoin, steri-strips, and clean dressings were applied. The patient was then extubated and brought to the recovery room in stable condition. Instrument, sponge, and needle counts were correct at closure and at the conclusion of the case.   Findings: Cholecystitis with Cholelithiasis  Estimated Blood Loss: Minimal         Drains: none         Specimens: Gallbladder           Complications: None; patient tolerated the procedure well.         Disposition: PACU - hemodynamically stable.         Condition: stable  Wilmon Arms. Corliss Skains, MD, [facility]  General/ Trauma Surgery  04/12/2015 12:17 PM

## 2015-04-12 NOTE — Anesthesia Postprocedure Evaluation (Signed)
Anesthesia Post Note  Patient: Gloria Brown  Procedure(s) Performed: Procedure(s) (LRB): LAPAROSCOPIC CHOLECYSTECTOMY WITH INTRAOPERATIVE CHOLANGIOGRAM (N/A)  Patient location during evaluation: PACU Anesthesia Type: General Level of consciousness: sedated Pain management: pain level controlled Vital Signs Assessment: post-procedure vital signs reviewed and stable Respiratory status: spontaneous breathing and respiratory function stable Cardiovascular status: stable Anesthetic complications: no    Last Vitals:  Filed Vitals:   04/12/15 1315 04/12/15 1330  BP: 125/80 105/74  Pulse: 61 63  Temp:    Resp: 14 13    Last Pain:  Filed Vitals:   04/12/15 1338  PainSc: 8                  Raevon Broom DANIEL

## 2015-04-12 NOTE — Progress Notes (Signed)
  Subjective: Patient still with some upper abdominal pain, mild nausea More comfortable than on admission  Objective: Vital signs in last 24 hours: Temp:  [98.4 F (36.9 C)-98.8 F (37.1 C)] 98.4 F (36.9 C) (01/30 0542) Pulse Rate:  [56-85] 56 (01/30 0542) Resp:  [16-18] 18 (01/30 0542) BP: (103-152)/(71-103) 137/72 mmHg (01/30 0542) SpO2:  [95 %-100 %] 100 % (01/30 0542) Weight:  [60.5 kg (133 lb 6.1 oz)-61.054 kg (134 lb 9.6 oz)] 60.5 kg (133 lb 6.1 oz) (01/29 2351)    Intake/Output from previous day: 01/29 0701 - 01/30 0700 In: 704.2 [I.V.:654.2; IV Piggyback:50] Out: -  Intake/Output this shift:    General appearance: alert, cooperative and no distress Resp: clear to auscultation bilaterally Cardio: regular rate and rhythm, S1, S2 normal, no murmur, click, rub or gallop GI: mild RUQ and epigastric tenderness  Lab Results:   Recent Labs  04/11/15 1759  WBC 6.2  HGB 12.2  HCT 36.4  PLT 304   BMET  Recent Labs  04/11/15 1759  NA 142  K 3.3*  CL 104  CO2 25  GLUCOSE 99  BUN 11  CREATININE 0.63  CALCIUM 9.3   Hepatic Function Latest Ref Rng 04/11/2015  Total Protein 6.5 - 8.1 g/dL 7.6  Albumin 3.5 - 5.0 g/dL 4.1  AST 15 - 41 U/L 72(H)  ALT 14 - 54 U/L 258(H)  Alk Phosphatase 38 - 126 U/L 138(H)  Total Bilirubin 0.3 - 1.2 mg/dL 0.4     PT/INR No results for input(s): LABPROT, INR in the last 72 hours. ABG No results for input(s): PHART, HCO3 in the last 72 hours.  Invalid input(s): PCO2, PO2  Studies/Results: US Abdomen Complete  04/11/2015  CLINICAL DATA:  Upper abdominal pain for 4 days. EXAM: ABDOMEN ULTRASOUND COMPLETE COMPARISON:  None. FINDINGS: Gallbladder: The gallbladder wall is thickened at 4.1 mm. Mobile stones and sludge are present. There is no sonographic Murphy's sign. Common bile duct: Diameter: 1.9 mm, within normal limits Liver: No focal lesion identified. Within normal limits in parenchymal echogenicity. IVC: No abnormality  visualized. Pancreas: Visualized portion unremarkable. Spleen: 5.8 cm, within normal limits.  No focal lesions are present. Right Kidney: Length: 10.6 cm, within normal limits a. Echogenicity within normal limits. No mass or hydronephrosis visualized. Left Kidney: Length: 10.8 cm, within normal limits. Echogenicity within normal limits. No mass or hydronephrosis visualized. Abdominal aorta: 2.1 cm, within normal limits Other findings: None. IMPRESSION: 1. Mobile gallstones and sludge with mild wall thickening. This is concerning for early cholecystitis. 2. Otherwise negative ultrasound. Electronically Signed   By: San Morelle M.D.   On: 04/11/2015 20:54    Anti-infectives: Anti-infectives    Start     Dose/Rate Route Frequency Ordered Stop   04/12/15 0000  cefTRIAXone (ROCEPHIN) 2 g in dextrose 5 % 50 mL IVPB     2 g 100 mL/hr over 30 Minutes Intravenous Every 24 hours 04/11/15 2359        Assessment/Plan: s/p Procedure(s): LAPAROSCOPIC CHOLECYSTECTOMY WITH INTRAOPERATIVE CHOLANGIOGRAM (N/A) Acute cholecystitis - plan laparoscopic cholecystectomy today.  The surgical procedure has been discussed with the patient.  Potential risks, benefits, alternative treatments, and expected outcomes have been explained.  All of the patient's questions at this time have been answered.  The likelihood of reaching the patient's treatment goal is good.  The patient understand the proposed surgical procedure and wishes to proceed.   LOS: 1 day    Araceli Coufal K. 04/12/2015

## 2015-04-12 NOTE — Progress Notes (Signed)
Report called to Short stay  

## 2015-04-13 ENCOUNTER — Encounter (HOSPITAL_COMMUNITY): Payer: Self-pay | Admitting: Surgery

## 2015-04-13 MED ORDER — ACETAMINOPHEN 325 MG PO TABS: 650.0000 mg | ORAL_TABLET | Freq: Four times a day (QID) | ORAL | Status: AC | PRN

## 2015-04-13 MED ORDER — INFLUENZA VAC SPLIT QUAD 0.5 ML IM SUSY: 0.5000 mL | PREFILLED_SYRINGE | INTRAMUSCULAR | Status: DC

## 2015-04-13 MED ORDER — OXYCODONE-ACETAMINOPHEN 5-325 MG PO TABS: 1.0000 | ORAL_TABLET | ORAL | Status: AC | PRN

## 2015-04-13 NOTE — Progress Notes (Signed)
Interpreter Wyvonnia Dusky for [employer]

## 2015-04-13 NOTE — Discharge Summary (Signed)
Physician Discharge Summary  Patient ID: Gloria Brown MRN: [id number] DOB/AGE: 10-Nov-1985 30 y.o.  Admit date: 04/11/2015 Discharge date: 04/13/2015  Admission Diagnoses:  Calculus of gallbladder with acute cholecystitis, without mention of obstruction  Discharge Diagnoses:   Same   Active Problems:   Cholecystitis, acute with cholelithiasis   PROCEDURES: Laparoscopic Cholecystectomy with IOC, 04/12/15, DR. Gwyndolyn Kaufman.    Hospital Course:  30 yo Hispanic female came to ED for ongoing nausea/vomiting/abd pain. Some of the emesis became blood tinged. She developed upper abd pain starting Thursday. It has been intermittent but some discomfort has been constant. She has had ongoing episodes of n/v. No prior episodes. No fevers but chills. Moving bowels. No prior symptoms. Denies allergies and daily medications. Denies daily prescriptions. Has had surgery "to prevent pregnancy". No smoking. Has english speaking friend at bedside.  She was admitted by Dr. Andrey Campanile and taken to the OR the following day by Dr. Corliss Skains.  Pt tolerated the procedure well and was walking, tolerating diet well the following AM.  We use phone interepreter service [phone] to go over everything with her.  I came back later and reviewed with interpreter [id number].  She was ready for discharge late AM on POD #1.  Vital signs in last 24 hours: Temp: [97.5 F (36.4 C)-98.9 F (37.2 C)] 98.8 F (37.1 C) (01/31 0513) Pulse Rate: [60-92] 61 (01/31 0513) Resp: [13-19] 18 (01/31 0513) BP: (101-125)/(57-83) 105/57 mmHg (01/31 0513) SpO2: [97 %-100 %] 98 % (01/31 0513) Afebrile, VSS LFTs improving, CBC stable IOC: Intraoperative cholangiogram demonstrates extrahepatic biliary ducts of unremarkable caliber, with no large filling defect identified. Free flow of contrast across the ampulla.  General appearance: alert, cooperative and no distress GI: soft sore, sites dressings intact  Condition on discharge:  Improved      Disposition: Final discharge disposition not confirmed     Medication List    TAKE these medications        acetaminophen 325 MG tablet  Commonly known as:  TYLENOL  Take 2 tablets (650 mg total) by mouth every 6 (six) hours as needed (Your prescribed pain medicine has Tylenol(acetaminophen) in it so you cannot take more than 12 per day.).     oxyCODONE-acetaminophen 5-325 MG tablet  Commonly known as:  PERCOCET/ROXICET  Take 1 tablet by mouth every 4 (four) hours as needed for moderate pain.           Follow-up Information    Follow up with [facility] On 05/05/2015.   Specialty:  General Surgery   Why:  Your appointment is at 11 AM, be at the office 30 minutes early for check in.   Contact information:   [address] [place] [address] [phone]       Signed: Sherrie George 04/13/2015, 10:36 AM

## 2015-04-13 NOTE — OR Nursing (Signed)
Dilaudid  0.5 mg was wasted on this patient. Witnessed by Richardo Priest, RN

## 2015-04-13 NOTE — Progress Notes (Signed)
Francesco Sor to be D/C'd  per MD order. Discussed with the patient and all questions fully answered. With interpreter's help  VSS, Skin clean, dry and intact without evidence of skin break down, no evidence of skin tears noted.  IV catheter discontinued intact. Site without signs and symptoms of complications. Dressing and pressure applied.  An After Visit Summary was printed and given to the patient. Patient received prescription.  D/c education completed with patient/family including follow up instructions, medication list, d/c activities limitations if indicated, with other d/c instructions as indicated by MD - patient able to verbalize understanding, all questions fully answered.   Patient instructed to return to ED, call 911, or call MD for any changes in condition.   Patient to be escorted via WC, and D/C home via private auto.

## 2017-08-27 IMAGING — RF DG CHOLANGIOGRAM OPERATIVE
1 series · 4 of 4 positions shown · non-contrast
Comparison: None.

CLINICAL DATA: 29-year-old female with a history of cholelithiasis.

EXAM:
INTRAOPERATIVE CHOLANGIOGRAM
TECHNIQUE: Cholangiographic images from the C-arm fluoroscopic device were
submitted for interpretation post-operatively. Please see the
procedural report for the amount of contrast and the fluoroscopy
time utilized.

[Series 1: run · 4 of 23 frames shown]
[frame 4/23]
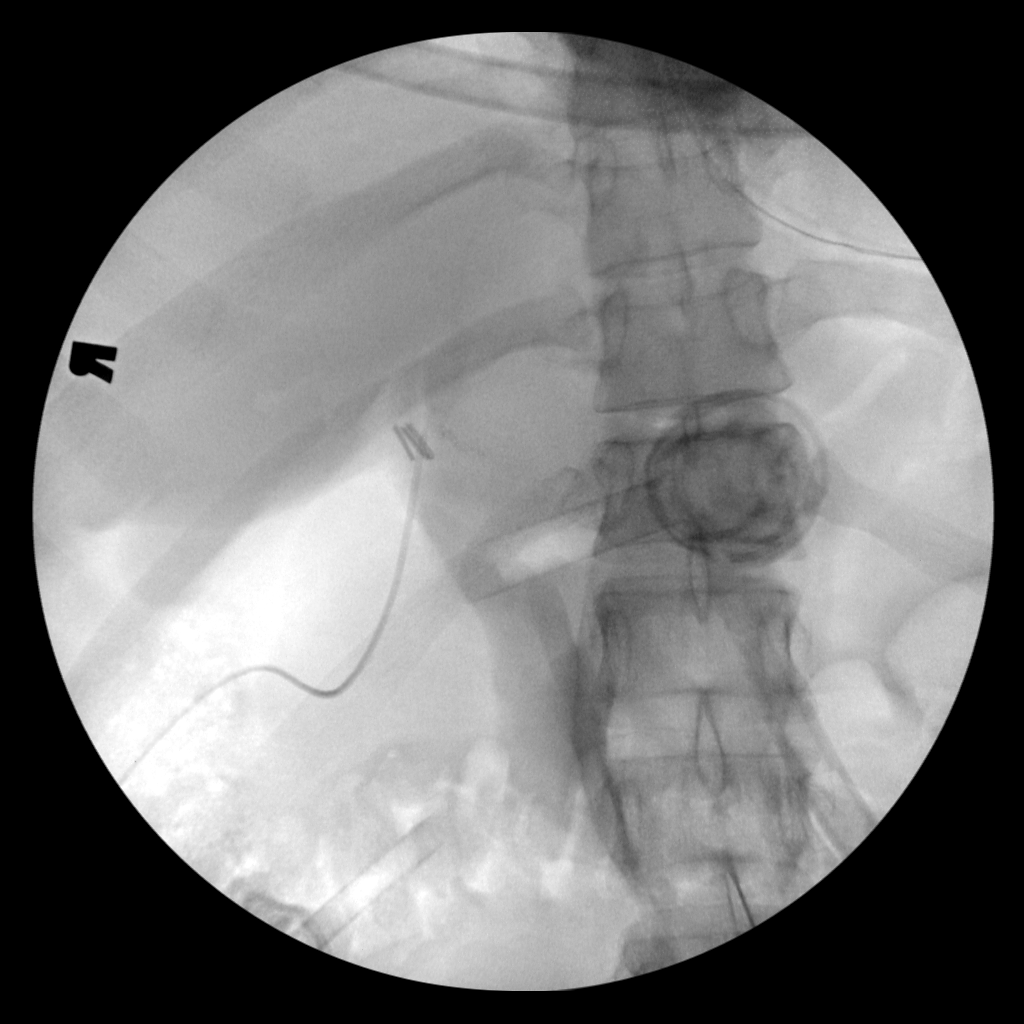
[frame 12/23]
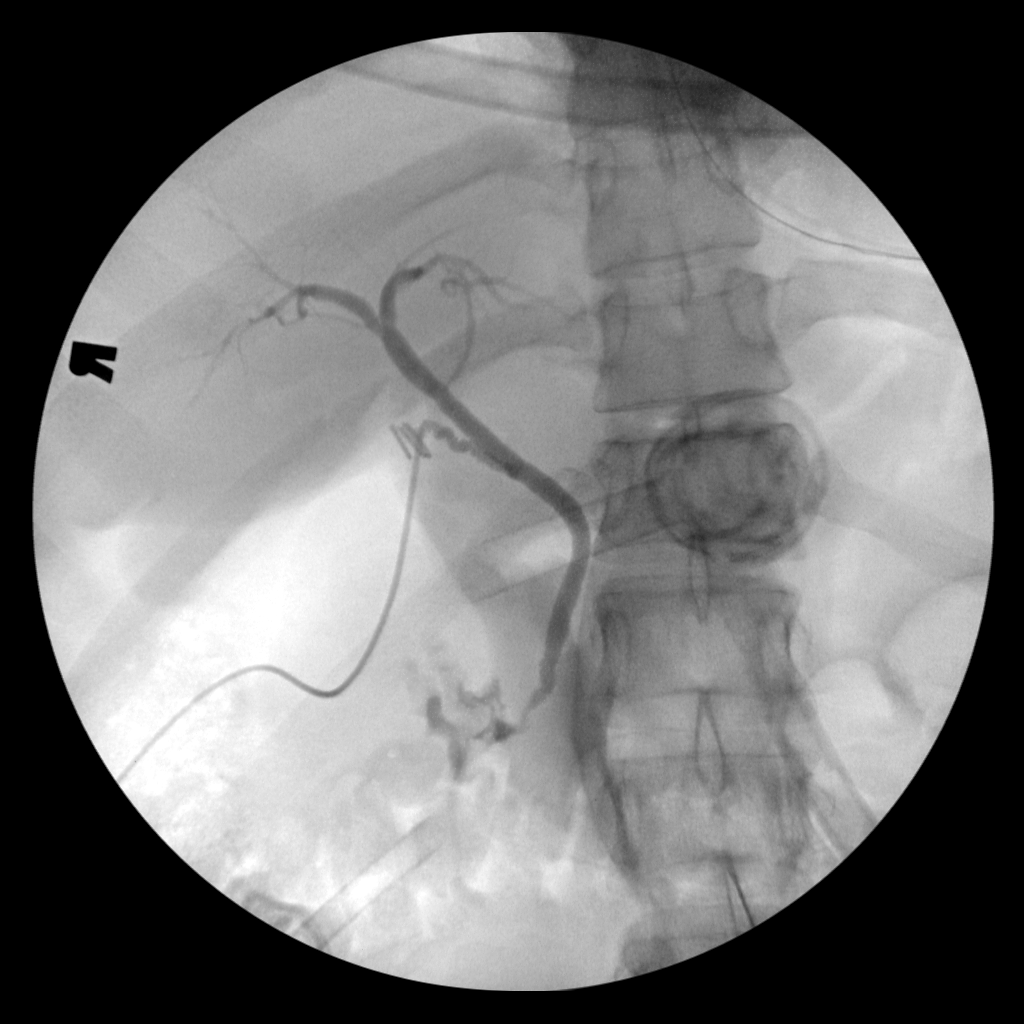
[frame 13/23]
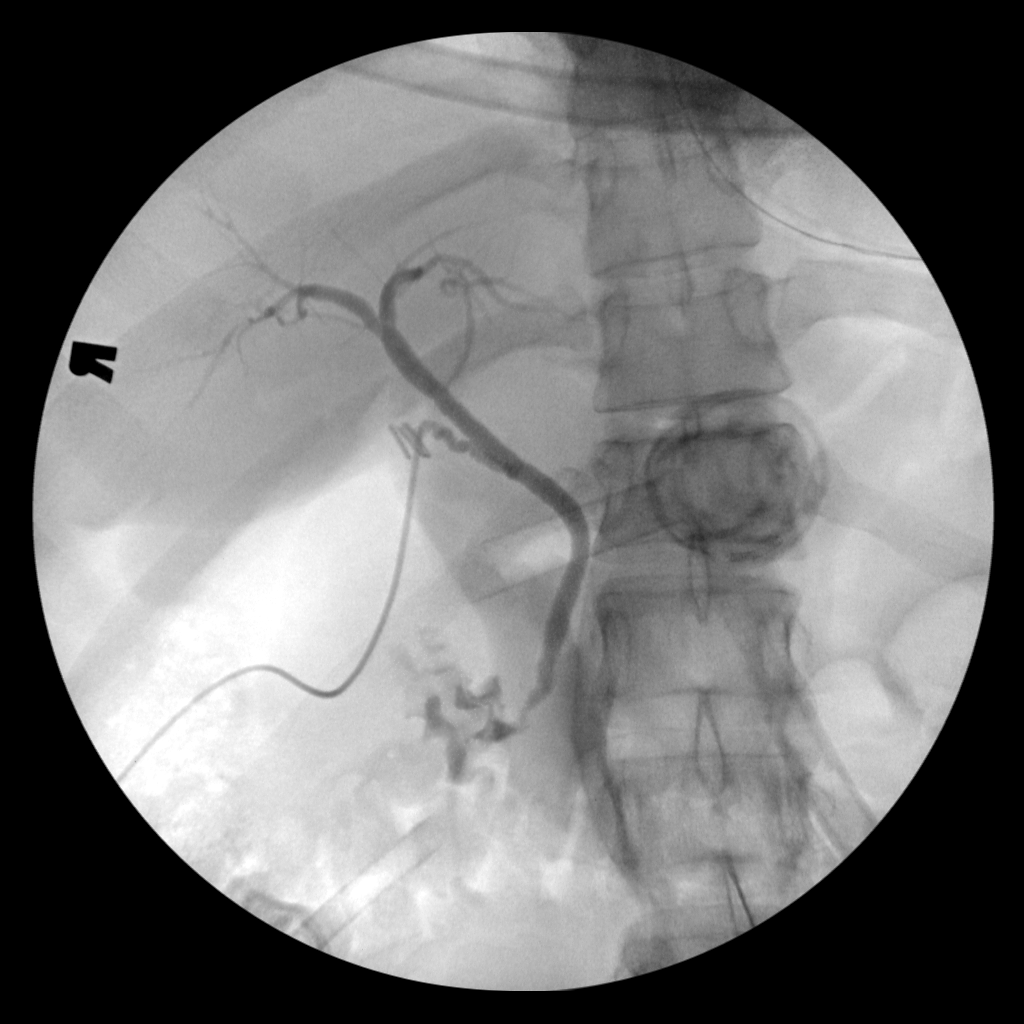
[frame 20/23]
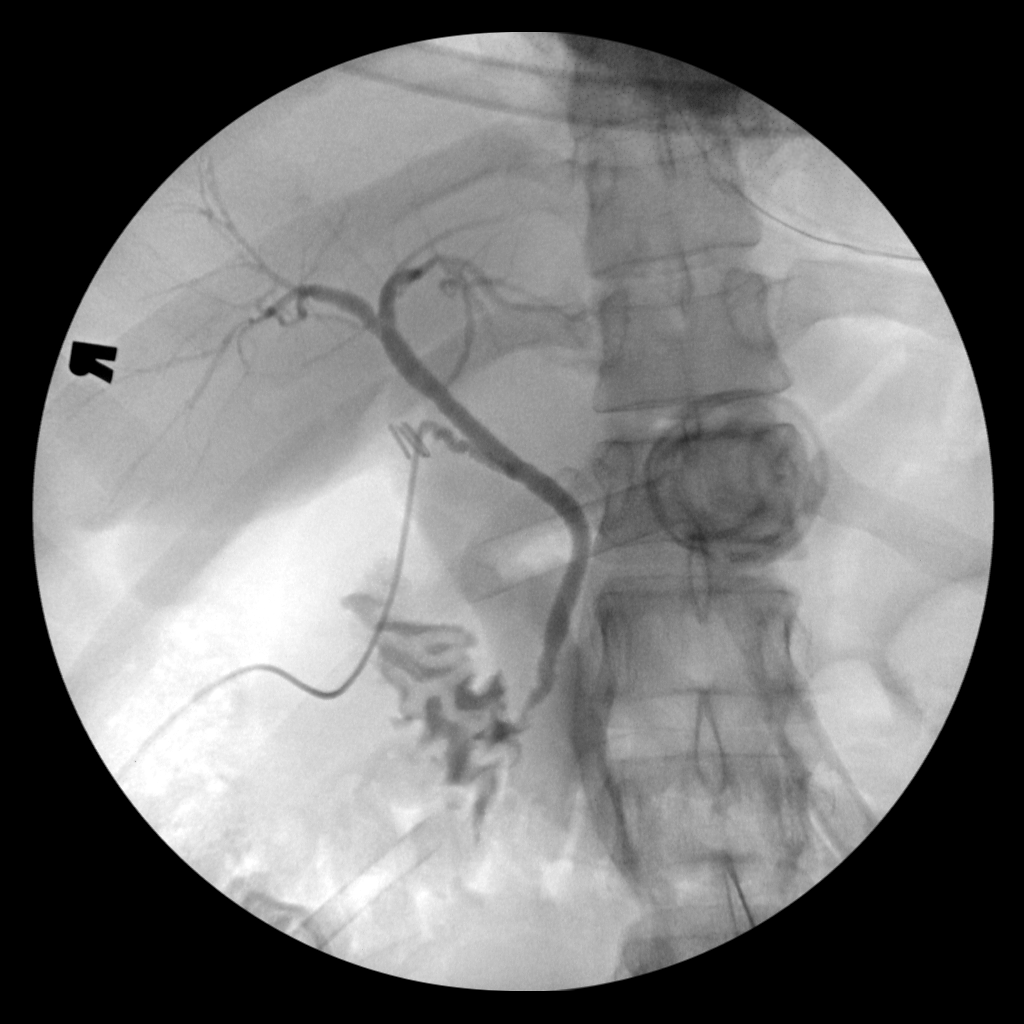

[4 of 4 positions shown; findings below may reference images not displayed]

FINDINGS: Surgical instruments project over the upper abdomen.

There is cannulation of the cystic duct/gallbladder neck, with
antegrade infusion of contrast. Caliber of the extrahepatic ductal
system within normal limits.

No large filling defect identified.

Free flow of contrast across the ampulla.
IMPRESSION: Intraoperative cholangiogram demonstrates extrahepatic biliary ducts
of unremarkable caliber, with no large filling defect identified.
Free flow of contrast across the ampulla.

Please refer to the dictated operative report for full details of
intraoperative findings and procedure

## 2017-09-14 IMAGING — US US ABDOMEN COMPLETE
1 series · 14 of 25 positions shown · non-contrast
Comparison: None.

CLINICAL DATA: Upper abdominal pain for 4 days.

EXAM:
ABDOMEN ULTRASOUND COMPLETE

[Series 1: us abdomen complete · 0.20mm/px · 14 of 72 slices shown]
[im 1/72]
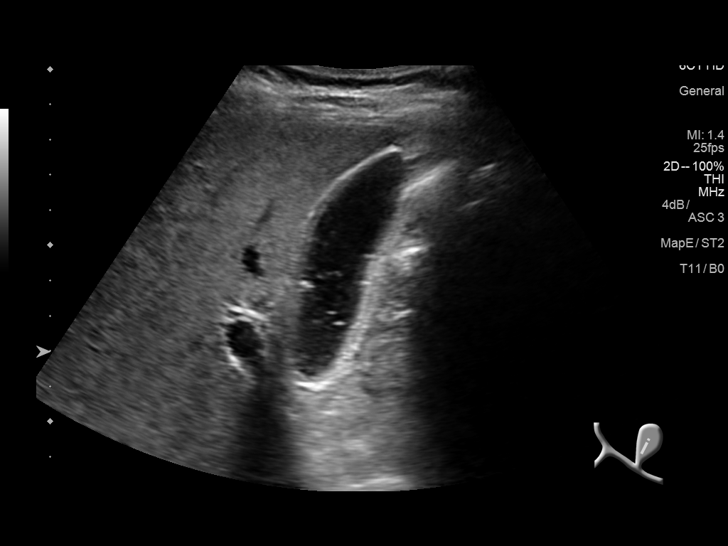
[im 6/72]
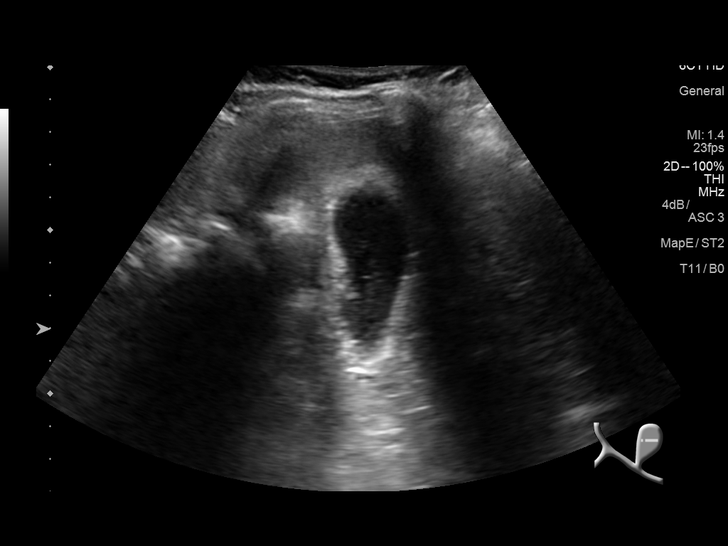
[im 12/72]
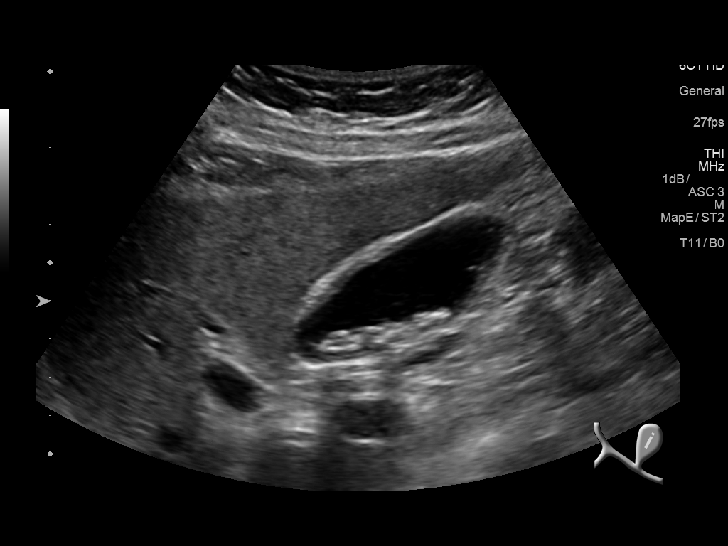
[im 18/72]
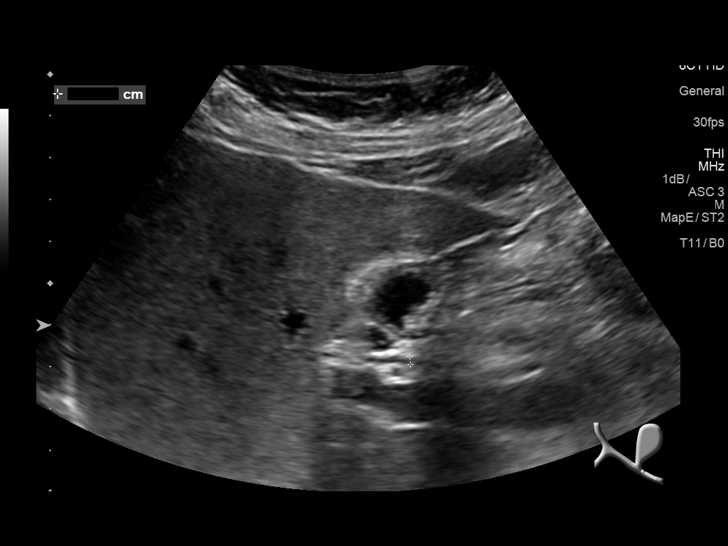
[im 24/72]
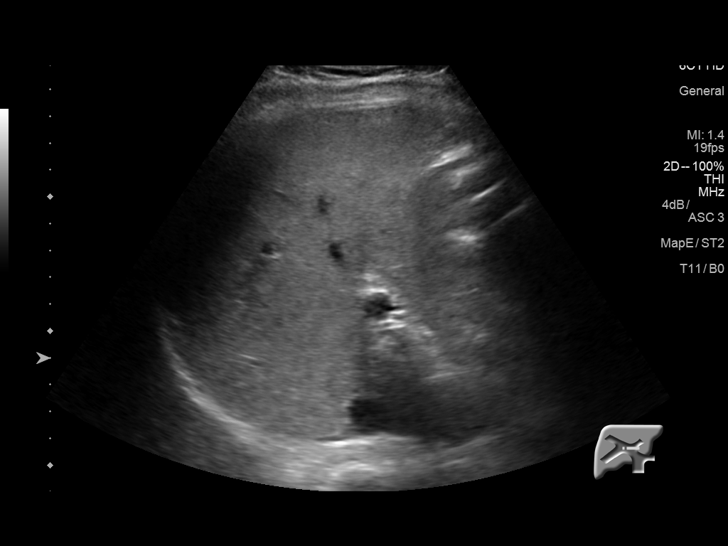
[im 27/72]
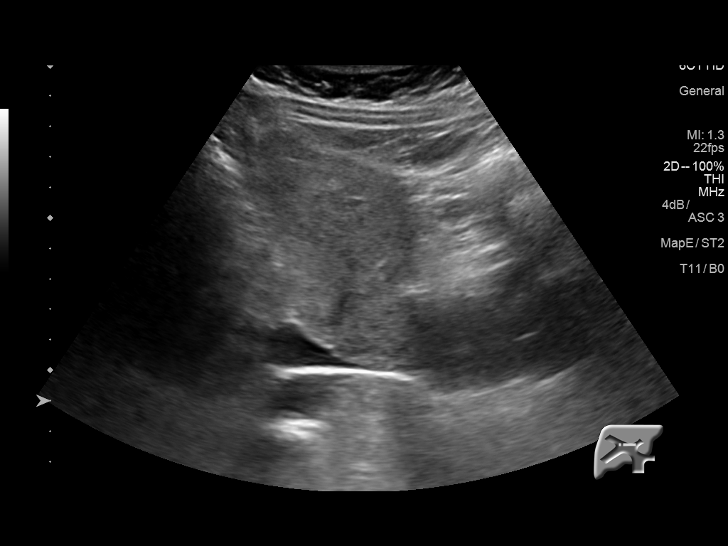
[im 33/72]
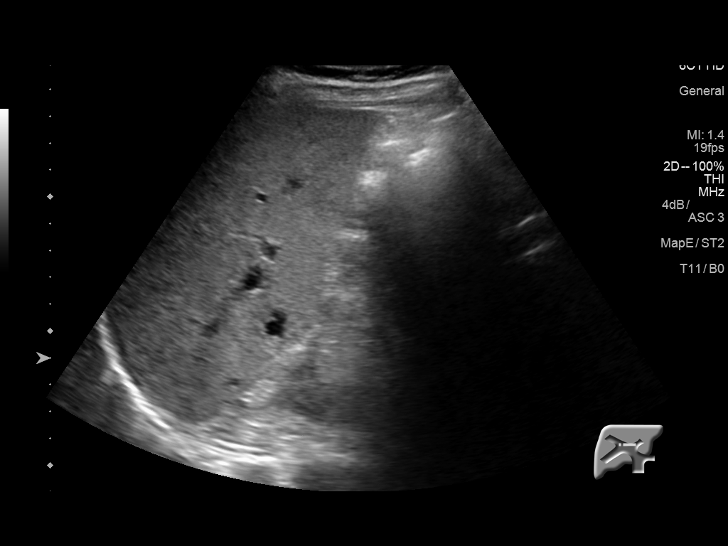
[im 39/72]
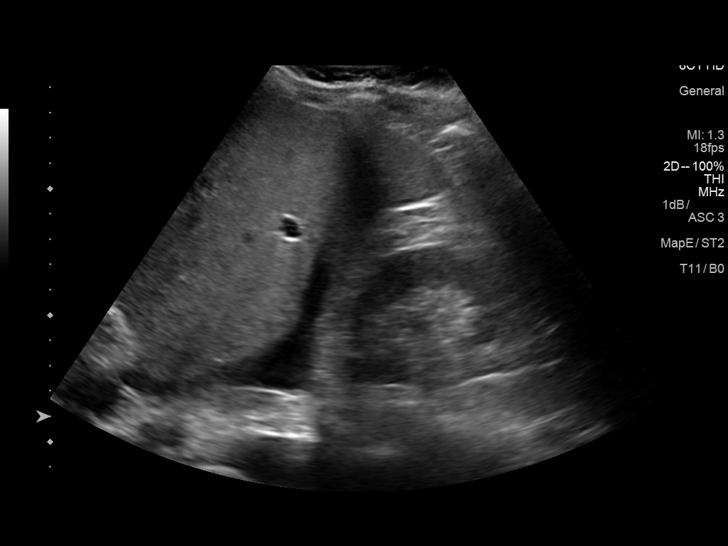
[im 45/72]
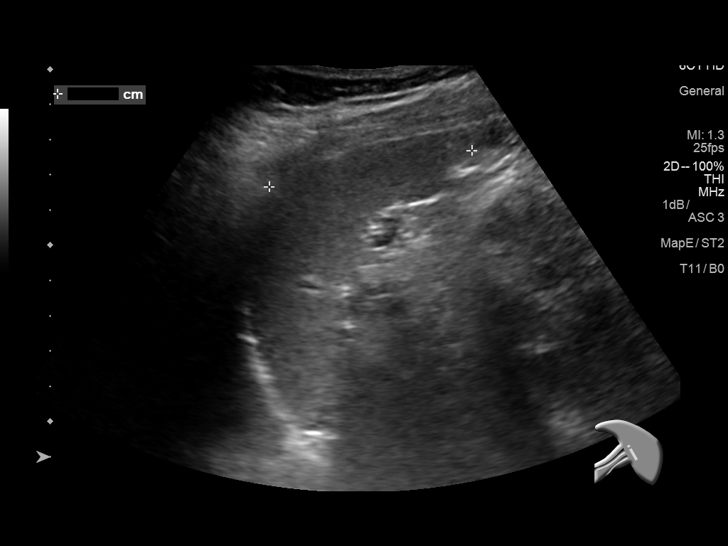
[im 48/72]
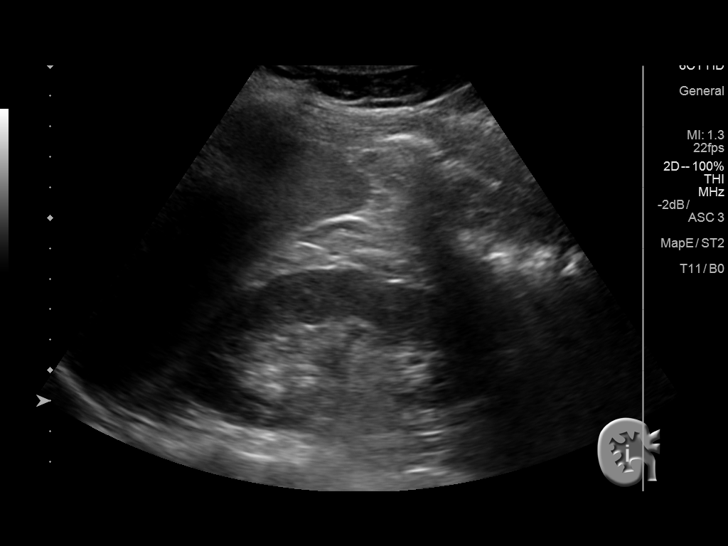
[im 54/72]
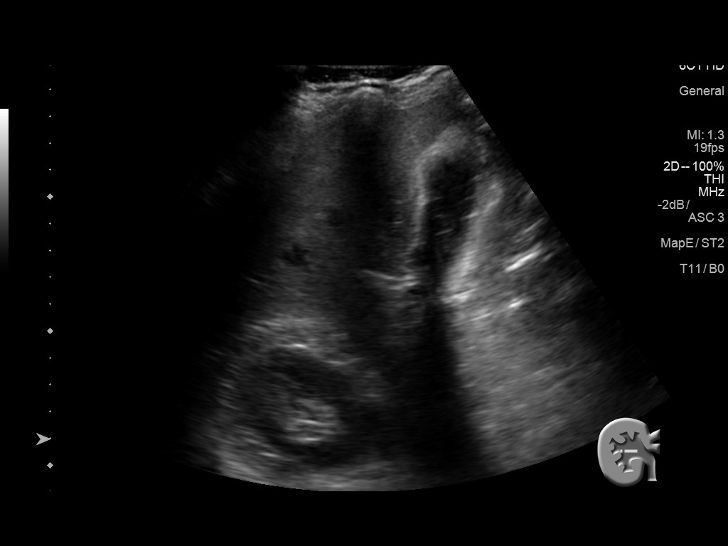
[im 60/72]
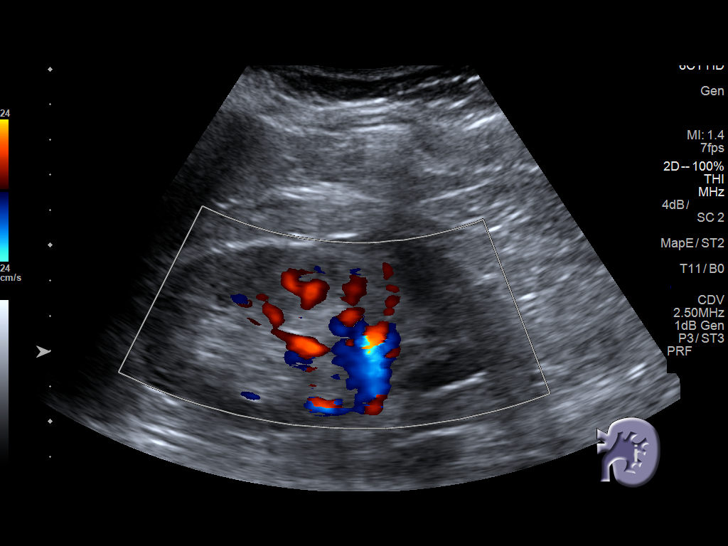
[im 66/72]
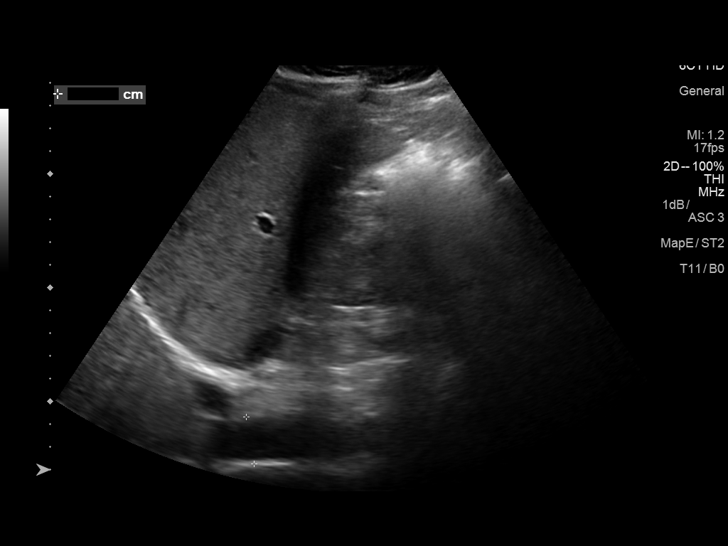
[im 72/72]
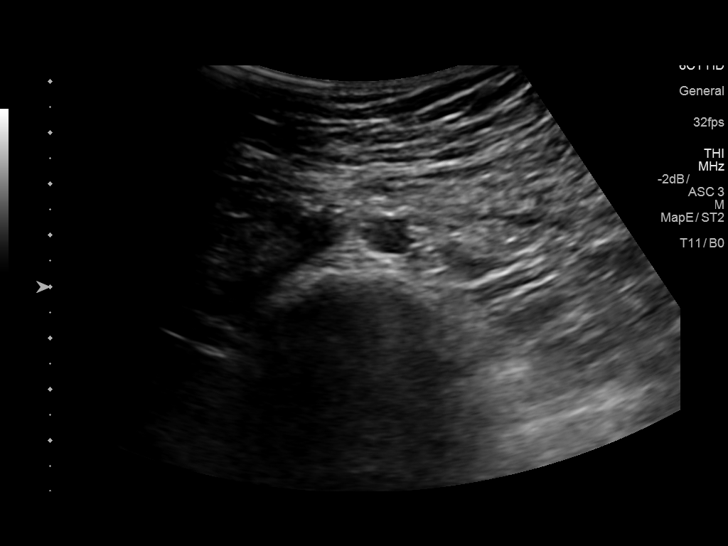

[14 of 25 positions shown; findings below may reference images not displayed]

FINDINGS: Gallbladder: The gallbladder wall is thickened at 4.1 mm. Mobile
stones and sludge are present. There is no sonographic Murphy's
sign.

Common bile duct: Diameter: 1.9 mm, within normal limits

Liver: No focal lesion identified. Within normal limits in
parenchymal echogenicity.

IVC: No abnormality visualized.

Pancreas: Visualized portion unremarkable.

Spleen: 5.8 cm, within normal limits.  No focal lesions are present.

Right Kidney: Length: 10.6 cm, within normal limits a. Echogenicity
within normal limits. No mass or hydronephrosis visualized.

Left Kidney: Length: 10.8 cm, within normal limits. Echogenicity
within normal limits. No mass or hydronephrosis visualized.

Abdominal aorta: 2.1 cm, within normal limits

Other findings: None.
IMPRESSION: 1. Mobile gallstones and sludge with mild wall thickening. This is
concerning for early cholecystitis.
2. Otherwise negative ultrasound.
